# Patient Record
Sex: Female | Born: 1970 | Hispanic: Yes | Smoking: Former smoker
Health system: Southern US, Community
[De-identification: ages and names within clinical notes are randomized; demographics above are authoritative.]

---

## 2019-07-14 ENCOUNTER — Ambulatory Visit: Payer: HRSA Program | Attending: Internal Medicine

## 2019-07-14 DIAGNOSIS — Z20822 Contact with and (suspected) exposure to covid-19: Secondary | ICD-10-CM | POA: Diagnosis present

## 2019-07-16 ENCOUNTER — Telehealth: Payer: Self-pay | Admitting: General Practice

## 2019-07-16 LAB — NOVEL CORONAVIRUS, NAA: SARS-CoV-2, NAA: NOT DETECTED

## 2019-07-16 NOTE — Telephone Encounter (Signed)
Negative COVID results given. Patient results "NOT Detected." Caller expressed understanding. ° °

## 2019-08-03 ENCOUNTER — Ambulatory Visit: Payer: Self-pay | Attending: Internal Medicine

## 2019-08-03 DIAGNOSIS — Z20822 Contact with and (suspected) exposure to covid-19: Secondary | ICD-10-CM | POA: Insufficient documentation

## 2019-08-04 ENCOUNTER — Telehealth: Payer: Self-pay | Admitting: General Practice

## 2019-08-04 LAB — NOVEL CORONAVIRUS, NAA: SARS-CoV-2, NAA: NOT DETECTED

## 2019-08-04 NOTE — Telephone Encounter (Signed)
Negative COVID results given. Patient results "NOT Detected." Caller expressed understanding. ° °

## 2021-01-30 ENCOUNTER — Other Ambulatory Visit: Payer: Self-pay

## 2021-01-30 ENCOUNTER — Encounter: Payer: Self-pay | Admitting: *Deleted

## 2021-01-30 ENCOUNTER — Ambulatory Visit: Payer: Self-pay

## 2021-01-30 ENCOUNTER — Ambulatory Visit
Admission: RE | Admit: 2021-01-30 | Discharge: 2021-01-30 | Disposition: A | Discharge status: Home / Self Care | Payer: Self-pay | Source: Ambulatory Visit | Attending: Oncology | Admitting: Oncology

## 2021-01-30 ENCOUNTER — Ambulatory Visit: Payer: Self-pay | Attending: Oncology | Admitting: *Deleted

## 2021-01-30 ENCOUNTER — Encounter (INDEPENDENT_AMBULATORY_CARE_PROVIDER_SITE_OTHER): Payer: Self-pay

## 2021-01-30 VITALS — BP 105/70 | HR 71 | Temp 97.5°F | Ht 63.78 in | Wt 150.3 lb

## 2021-01-30 DIAGNOSIS — Z0000 Encounter for general adult medical examination without abnormal findings: Secondary | ICD-10-CM

## 2021-01-30 NOTE — Patient Instructions (Signed)
Gave patient hand-out, Women Staying Healthy, Active and Well from [facility], with education on breast health, pap smears, heart and colon health. 

## 2021-01-30 NOTE — Progress Notes (Signed)
  Subjective:     Patient ID: Virginia Alvarez, female   DOB: 1971-04-07, 50 y.o.   MRN: [id number]  HPI  [facility] Medical History Record - 01/30/21 0957       Breast History   Screening cycle New    Provider ([facility]) [facility]    Initial Mammogram 01/30/21    Last Mammogram Never    Recent Breast Symptoms None      Breast Cancer History   Breast Cancer History No personal or family history      Previous History of Breast Problems   Breast Surgery or Biopsy None    Breast Implants N/A    BSE Done Never      Gynecological/Obstetrical History   LMP 01/30/21    Is there any chance that the client could be pregnant?  No    Age at menarche [age]    Age at menopause n/a    PAP smear history Annually    Date of last PAP  11/17/20    Provider (P[facility]) [facility]    Age at first live birth 6    Breast fed children Yes (type length in comments)   1 year   DES Exposure No    Cervical, Uterine or Ovarian cancer No    Family history of Cervial, Uterine or Ovarian cancer No    Hysterectomy No    Cervix removed No    Ovaries removed No    Laser/Cryosurgery No    Current method of birth control None    Current method of Estrogen/Hormone replacement None    Smoking history None    Comments no insurance               Review of Systems     Objective:   Physical Exam Chest:  Breasts:    Right: No swelling, bleeding, inverted nipple, mass, nipple discharge, skin change, tenderness, axillary adenopathy or supraclavicular adenopathy.     Left: No swelling, bleeding, inverted nipple, mass, nipple discharge, skin change, tenderness, axillary adenopathy or supraclavicular adenopathy.  Lymphadenopathy:     Upper Body:     Right upper body: No supraclavicular or axillary adenopathy.     Left upper body: No supraclavicular or axillary adenopathy.      Assessment:     50 year old Hispanic female referred to [facility] by Jeralyn Ruths for clinical breast exam and baseline mammogram.   Claretha Cooper, the interpreter present during the interview and exam.  Clinical breast exam unremarkable.  Taught self breast awareness.  Last pap per patient was in May of 2022.  There are no results available to review.  Patient has been screened for eligibility.  She does not have any insurance, Medicare or Medicaid.  She also meets financial eligibility.   Risk Assessment     Risk Scores       01/30/2021   Last edited by: Scarlett Presto, RN   5-year risk: 0.7 %   Lifetime risk: 6.6 %               Plan:     Screening mammogram ordered.  Will follow up per [facility] protocol.  Next pap per AS[facility]P guidelines.

## 2021-02-01 ENCOUNTER — Other Ambulatory Visit: Payer: Self-pay | Admitting: Oncology

## 2021-02-01 DIAGNOSIS — R928 Other abnormal and inconclusive findings on diagnostic imaging of breast: Secondary | ICD-10-CM

## 2021-02-11 ENCOUNTER — Ambulatory Visit
Admission: RE | Admit: 2021-02-11 | Discharge: 2021-02-11 | Disposition: A | Discharge status: Home / Self Care | Payer: Self-pay | Source: Ambulatory Visit | Attending: Oncology | Admitting: Oncology

## 2021-02-11 ENCOUNTER — Other Ambulatory Visit: Payer: Self-pay

## 2021-02-11 DIAGNOSIS — R928 Other abnormal and inconclusive findings on diagnostic imaging of breast: Secondary | ICD-10-CM

## 2021-02-14 ENCOUNTER — Other Ambulatory Visit: Payer: Self-pay | Admitting: *Deleted

## 2021-02-14 DIAGNOSIS — N63 Unspecified lump in unspecified breast: Secondary | ICD-10-CM

## 2021-02-27 ENCOUNTER — Encounter: Payer: Self-pay | Admitting: *Deleted

## 2021-02-27 NOTE — Progress Notes (Signed)
Letter mailed to inform patient of her 6 month follow up appointment on 08/15/21 @ 10:20.

## 2021-08-15 ENCOUNTER — Ambulatory Visit
Admission: RE | Admit: 2021-08-15 | Discharge: 2021-08-15 | Disposition: A | Discharge status: Home / Self Care | Payer: Self-pay | Source: Ambulatory Visit | Attending: Oncology | Admitting: Oncology

## 2021-08-15 ENCOUNTER — Other Ambulatory Visit: Payer: Self-pay

## 2021-08-15 DIAGNOSIS — N63 Unspecified lump in unspecified breast: Secondary | ICD-10-CM | POA: Insufficient documentation

## 2022-01-08 ENCOUNTER — Other Ambulatory Visit: Payer: Self-pay

## 2022-01-08 DIAGNOSIS — N63 Unspecified lump in unspecified breast: Secondary | ICD-10-CM

## 2022-02-19 ENCOUNTER — Ambulatory Visit: Payer: Self-pay

## 2022-02-24 ENCOUNTER — Other Ambulatory Visit: Payer: Self-pay

## 2022-02-24 ENCOUNTER — Ambulatory Visit: Payer: Self-pay | Attending: Hematology and Oncology | Admitting: *Deleted

## 2022-02-24 VITALS — BP 99/54 | Wt 141.7 lb

## 2022-02-24 DIAGNOSIS — Z1211 Encounter for screening for malignant neoplasm of colon: Secondary | ICD-10-CM

## 2022-02-24 DIAGNOSIS — Z1239 Encounter for other screening for malignant neoplasm of breast: Secondary | ICD-10-CM

## 2022-02-24 NOTE — Progress Notes (Addendum)
[name] is a 51 y.o. female who presents to [facility] clinic today with complaint of bilateral upper breast pain and lumps x 5 months. Patient states the pain comes and goes. Patient rates the pain at a 6 out of 10. Patient had a right breast diagnostic mammogram and ultrasound completed 08/15/2021 that a bilateral diagnostic mammogram is recommended for follow up in 6 months.   Pap Smear: Pap smear not completed today. Last Pap smear was 11/17/2020 at [facility] clinic and was normal. Per patient is unsure if she has had an abnormal Pap smear. 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 She is unsure if her Pap smear was abnormal also. Patient states that all Pap smears have been normal since treatment and that she has had at least three normal Pap smears. Last Pap smear result is available in Epic.   Physical exam: Breasts Breasts symmetrical. No skin abnormalities bilateral breasts. No nipple retraction bilateral breasts. No nipple discharge bilateral breasts. No lymphadenopathy. No lumps palpated bilateral breasts. No complaints of pain or tenderness on exam.     MS DIGITAL DIAG TOMO UNI RIGHT  Result Date: 08/15/2021 CLINICAL DATA:  51 year old female presenting for six-month follow-up of a probably benign right breast asymmetry. EXAM: DIGITAL DIAGNOSTIC UNILATERAL RIGHT MAMMOGRAM WITH TOMOSYNTHESIS AND CAD; ULTRASOUND RIGHT BREAST LIMITED TECHNIQUE: Right digital diagnostic mammography and breast tomosynthesis was performed. The images were evaluated with computer-aided detection.; Targeted ultrasound examination of the right breast was performed COMPARISON:  Previous exam(s). ACR Breast Density Category d: The breast tissue is extremely dense, which lowers the sensitivity of mammography. FINDINGS: Mammogram: There is a stable asymmetry in the medial posterior right breast. There are no new suspicious findings elsewhere in the right breast. Ultrasound: Targeted  ultrasound performed throughout the medial aspect of the right breast demonstrating no cystic or solid mass. There is an [place] of normal tissue at 1 o'clock 5 cm from the nipple. IMPRESSION: Stable probably benign asymmetry in the medial posterior right breast. RECOMMENDATION: Diagnostic bilateral mammogram in 6 months. I have discussed the findings and recommendations with the patient. If applicable, a reminder letter will be sent to the patient regarding the next appointment. BI-RADS CATEGORY  3: Probably benign. Electronically Signed   By: Emmaline Kluver M.D.   On: 08/15/2021 11:36  MM DIAG BREAST TOMO UNI RIGHT  Result Date: 02/11/2021 CLINICAL DATA:  Patient was recalled from baseline screening mammogram for a possible mass in the right breast. EXAM: DIGITAL DIAGNOSTIC UNILATERAL RIGHT MAMMOGRAM WITH TOMOSYNTHESIS AND CAD; ULTRASOUND RIGHT BREAST LIMITED TECHNIQUE: Right digital diagnostic mammography and breast tomosynthesis was performed. The images were evaluated with computer-aided detection.; Targeted ultrasound examination of the right breast was performed COMPARISON:  Previous exam(s). ACR Breast Density Category c: The breast tissue is heterogeneously dense, which may obscure small masses. FINDINGS: Additional imaging of the right breast was performed. There is persistence of a focal asymmetry in the upper inner quadrant of the breast. Targeted ultrasound is performed, showing an isoechoic mass in the right breast at 1 o'clock 5 cm from the nipple measuring 2.0 x 0.3 x 1.3 cm. This may be a normal island of fibroglandular tissue. IMPRESSION: Probable benign island of normal fibroglandular tissue in the upper inner quadrant of the right breast. RECOMMENDATION: Short-term interval follow-up right mammogram and ultrasound in 6 months is recommended. I have discussed the findings and recommendations with the patient. If applicable, a reminder letter will be sent to  the patient regarding the next  appointment. BI-RADS CATEGORY  3: Probably benign. Electronically Signed   By: Baird Lyons M.D.   On: 02/11/2021 15:16  MS DIGITAL SCREENING TOMO BILATERAL  Result Date: 01/31/2021 CLINICAL DATA:  Screening. EXAM: DIGITAL SCREENING BILATERAL MAMMOGRAM WITH TOMOSYNTHESIS AND CAD TECHNIQUE: Bilateral screening digital craniocaudal and mediolateral oblique mammograms were obtained. Bilateral screening digital breast tomosynthesis was performed. The images were evaluated with computer-aided detection. COMPARISON:  None. ACR Breast Density Category c: The breast tissue is heterogeneously dense, which may obscure small masses. FINDINGS: In the right breast, a possible mass warrants further evaluation. This possible mass is seen within the inner RIGHT breast, at posterior depth, cc view only, slice 44. In the left breast, no findings suspicious for malignancy. IMPRESSION: Further evaluation is suggested for a possible mass in the right breast. RECOMMENDATION: Diagnostic mammogram and possibly ultrasound of the right breast. (Code:[id number]-40M) The patient will be contacted regarding the findings, and additional imaging will be scheduled. BI-RADS CATEGORY  0: Incomplete. Need additional imaging evaluation and/or prior mammograms for comparison. Electronically Signed   By: Bary Richard M.D.   On: 01/31/2021 13:43    Pelvic/Bimanual Pap is not indicated today per [facility] guidelines.   Smoking History: Patient is a former smoker that quit over 30 years ago.   Patient Navigation: Patient education provided. Access to services provided for patient through [employer] program. Spanish interpreter Virginia Alvarez from [facility] provided. Patient stated she has food insecurities. Patient given a bag of groceries from the [facility] food pantry.  Colorectal Cancer Screening: Per patient has never had colonoscopy completed. FIT Test given to patient to complete. No complaints today.     Breast and Cervical Cancer Risk Assessment: Patient does not have family history of breast cancer, known genetic mutations, or radiation treatment to the chest before age 24. Patient does not have history of cervical dysplasia, immunocompromised, or DES exposure in-utero.  Risk Assessment     Risk Scores       02/24/2022 01/30/2021   Last edited by: Narda Rutherford, LPN Scarlett Presto, RN   5-year risk: 0.7 % 0.7 %   Lifetime risk: 6.5 % 6.6 %           A: [facility] exam without pap smear Complaint of bilateral upper breast lumps and pain.  P: Referred patient to the [facility] for a diagnostic mammogram per recommendation. Appointment scheduled Wednesday, March 12, 2022 at 1040.  Priscille Heidelberg, RN 02/24/2022 11:17 AM

## 2022-02-24 NOTE — Patient Instructions (Signed)
Explained breast self awareness with Levonne Hubert. Patient did not need a Pap smear today due to last Pap smear was 11/17/2020. Let her know [facility] will cover Pap smears every 3 years unless has a history of abnormal Pap smears. Referred patient to the [facility] for a diagnostic mammogram per recommendation. Appointment scheduled Wednesday, March 12, 2022 at 1040. Patient aware of appointment and will be there. Levonne Hubert verbalized understanding.  Ettore Trebilcock, Kathaleen Maser, RN 11:17 AM

## 2022-03-12 ENCOUNTER — Ambulatory Visit
Admission: RE | Admit: 2022-03-12 | Discharge: 2022-03-12 | Disposition: A | Discharge status: Home / Self Care | Payer: Self-pay | Source: Ambulatory Visit | Attending: Obstetrics and Gynecology | Admitting: Obstetrics and Gynecology

## 2022-03-12 DIAGNOSIS — N63 Unspecified lump in unspecified breast: Secondary | ICD-10-CM

## 2022-04-04 LAB — FECAL OCCULT BLOOD, IMMUNOCHEMICAL: Fecal Occult Bld: NEGATIVE

## 2022-04-10 ENCOUNTER — Telehealth: Payer: Self-pay

## 2022-04-10 NOTE — Telephone Encounter (Signed)
Via Julie Sowell, Spanish Interpreter ([place]), Patient informed negative FIT test results, verbalized understanding. 

## 2022-10-28 ENCOUNTER — Ambulatory Visit: Payer: Self-pay

## 2022-12-18 IMAGING — MG MM DIGITAL DIAGNOSTIC UNILAT*R* W/ TOMO W/ CAD
4 series · 4 of 12 positions shown · non-contrast
Comparison: Previous exam(s).

CLINICAL DATA: Patient was recalled from baseline screening
mammogram for a possible mass in the right breast.

EXAM:
DIGITAL DIAGNOSTIC UNILATERAL RIGHT MAMMOGRAM WITH TOMOSYNTHESIS AND
CAD; ULTRASOUND RIGHT BREAST LIMITED
TECHNIQUE: Right digital diagnostic mammography and breast tomosynthesis was
performed. The images were evaluated with computer-aided detection.;
Targeted ultrasound examination of the right breast was performed

[R CC synth-2D]
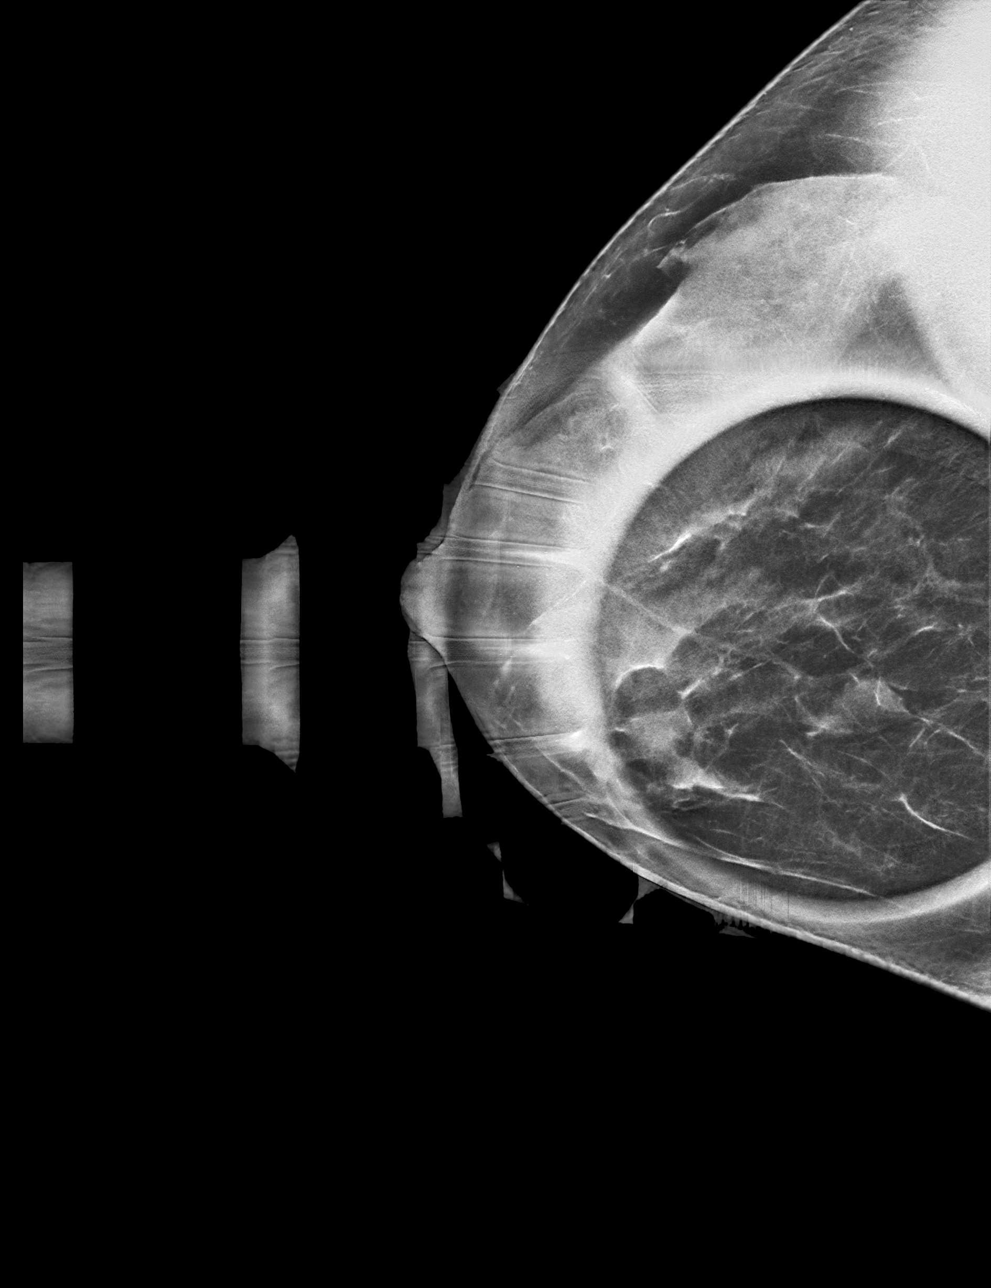

[R ML synth-2D]
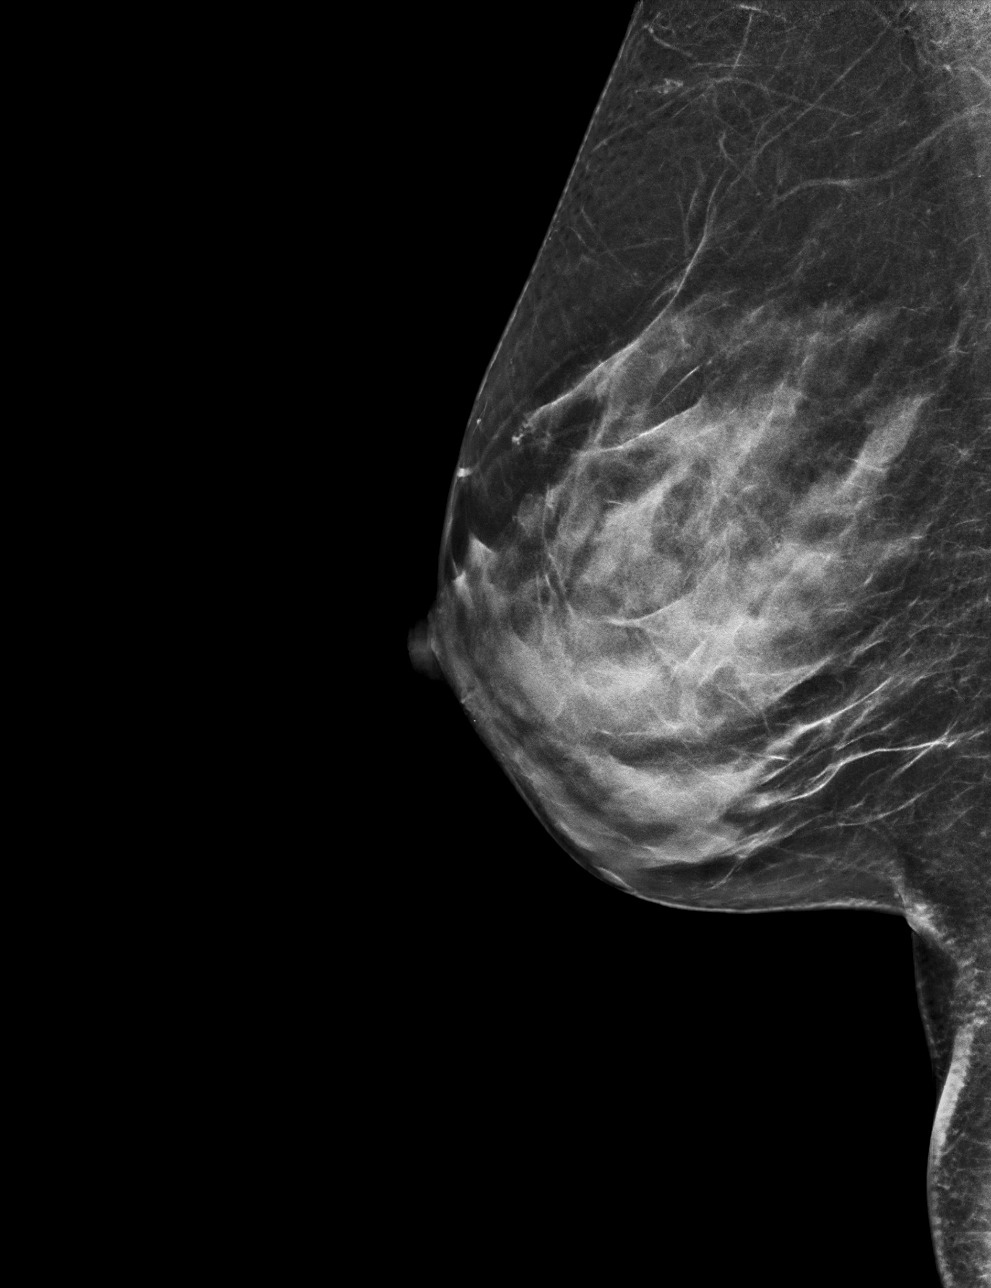

[R CC tomo · tomo slice 29/57.0]
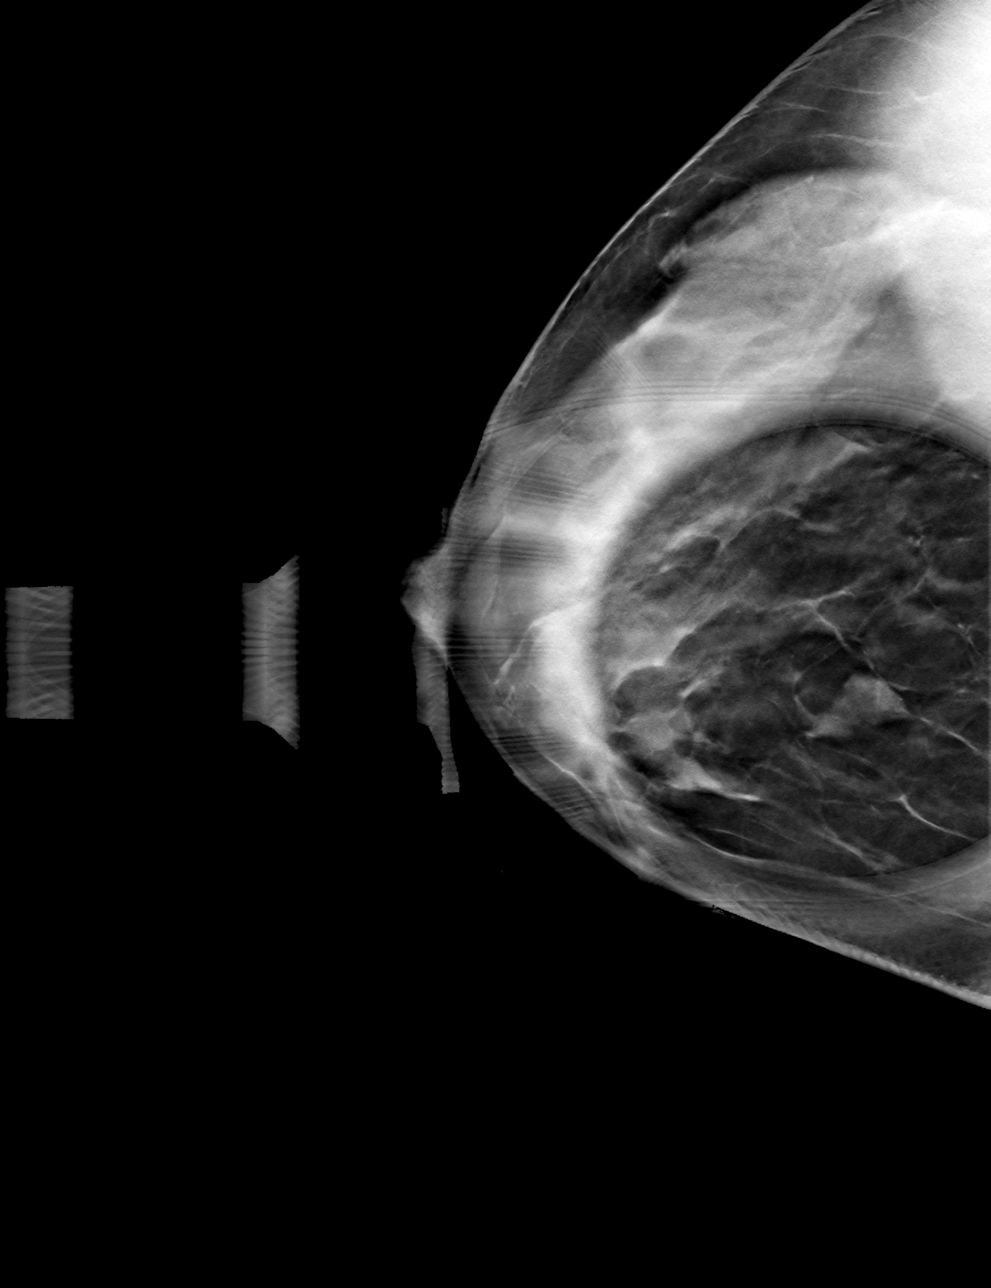

[R ML tomo · tomo slice 27/54.0]
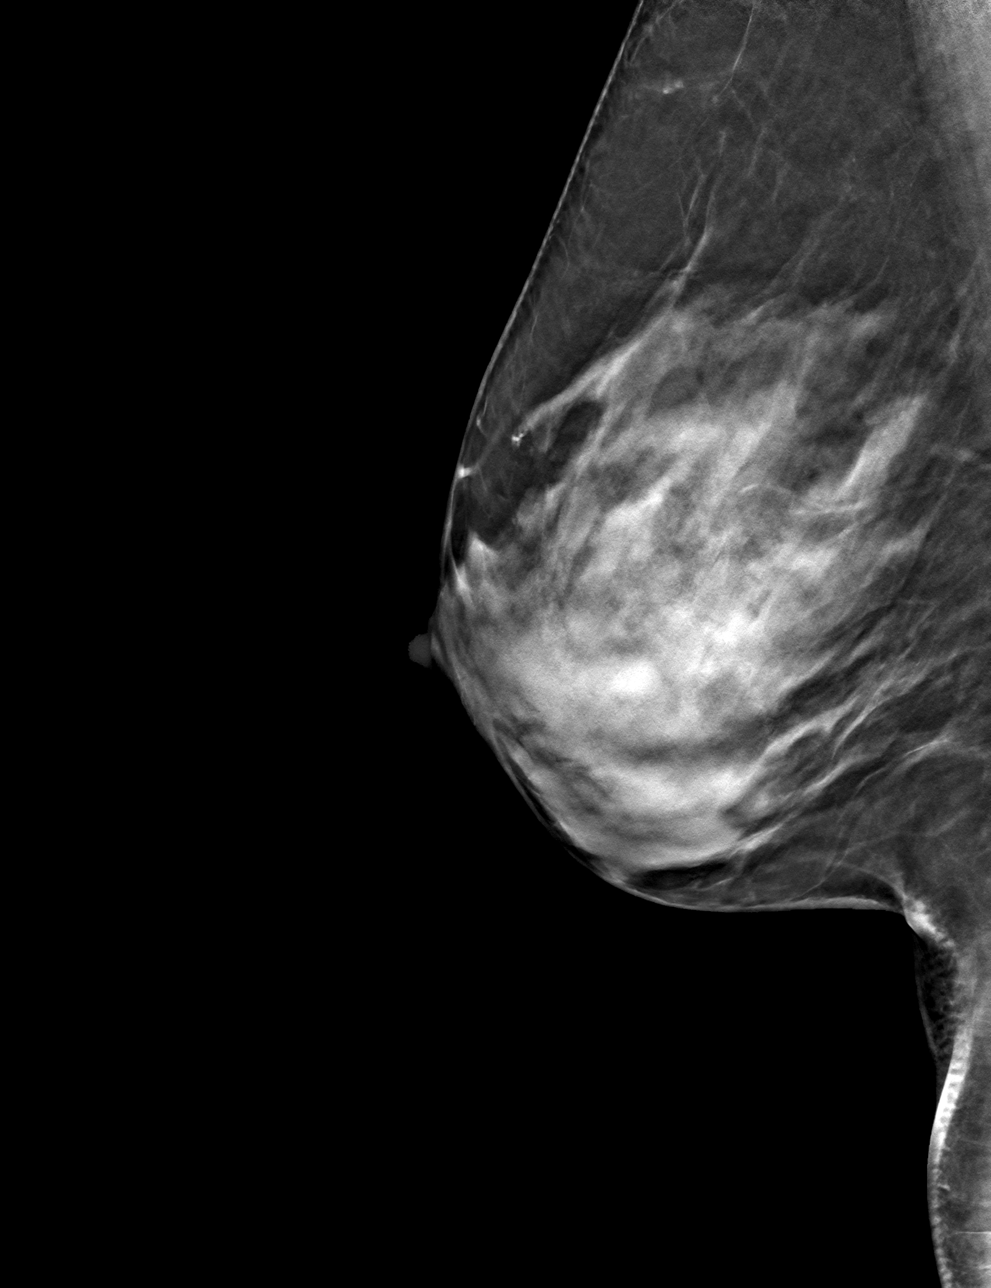

[4 of 12 positions shown; findings below may reference images not displayed]

ACR Breast Density Category c: The breast tissue is heterogeneously
dense, which may obscure small masses.
FINDINGS: Additional imaging of the right breast was performed. There is
persistence of a focal asymmetry in the upper inner quadrant of the
breast.

Targeted ultrasound is performed, showing an isoechoic mass in the
right breast at 1 o'clock 5 cm from the nipple measuring 2.0 x 0.3 x
1.3 cm. This may be a normal island of fibroglandular tissue.
IMPRESSION: Probable benign island of normal fibroglandular tissue in the upper
inner quadrant of the right breast.

RECOMMENDATION:
Short-term interval follow-up right mammogram and ultrasound in 6
months is recommended.

I have discussed the findings and recommendations with the patient.
If applicable, a reminder letter will be sent to the patient
regarding the next appointment.

BI-RADS CATEGORY  3: Probably benign.

## 2022-12-23 ENCOUNTER — Ambulatory Visit: Payer: Self-pay

## 2022-12-23 ENCOUNTER — Ambulatory Visit (LOCAL_COMMUNITY_HEALTH_CENTER): Payer: Self-pay

## 2022-12-23 DIAGNOSIS — Z719 Counseling, unspecified: Secondary | ICD-10-CM

## 2022-12-23 DIAGNOSIS — Z23 Encounter for immunization: Secondary | ICD-10-CM

## 2022-12-23 NOTE — Progress Notes (Addendum)
In nurse clinic with son and requesting covid vaccine.  Son served as Equities trader for pt; declination of agency interpreter services signed.  See imm flowsheet.  VIS given.  Bridge Comirnaty 12+ 2023-24 formula administered; tolerated well.  Stayed for observation 15 minutes and no problems.    [facility] updated and copy to pt.  Cherlynn Polo, RN

## 2023-01-15 ENCOUNTER — Encounter: Payer: Self-pay | Admitting: Advanced Practice Midwife

## 2023-01-15 ENCOUNTER — Ambulatory Visit (LOCAL_COMMUNITY_HEALTH_CENTER): Payer: Self-pay | Admitting: Advanced Practice Midwife

## 2023-01-15 VITALS — BP 101/70 | HR 72 | Ht 64.0 in | Wt 141.0 lb

## 2023-01-15 DIAGNOSIS — K581 Irritable bowel syndrome with constipation: Secondary | ICD-10-CM

## 2023-01-15 DIAGNOSIS — Z01419 Encounter for gynecological examination (general) (routine) without abnormal findings: Secondary | ICD-10-CM

## 2023-01-15 DIAGNOSIS — F329 Major depressive disorder, single episode, unspecified: Secondary | ICD-10-CM

## 2023-01-15 DIAGNOSIS — K589 Irritable bowel syndrome without diarrhea: Secondary | ICD-10-CM | POA: Insufficient documentation

## 2023-01-15 DIAGNOSIS — F32A Depression, unspecified: Secondary | ICD-10-CM | POA: Insufficient documentation

## 2023-01-15 DIAGNOSIS — Z3009 Encounter for other general counseling and advice on contraception: Secondary | ICD-10-CM

## 2023-01-15 DIAGNOSIS — R32 Unspecified urinary incontinence: Secondary | ICD-10-CM

## 2023-01-15 LAB — WET PREP FOR TRICH, YEAST, CLUE
Trichomonas Exam: NEGATIVE
Yeast Exam: NEGATIVE

## 2023-01-15 LAB — HEMOGLOBIN, FINGERSTICK: Hemoglobin: 13.9 g/dL (ref 11.1–15.9)

## 2023-01-15 LAB — HM HIV SCREENING LAB: HM HIV Screening: NEGATIVE

## 2023-01-15 NOTE — Progress Notes (Signed)
Pt is here for PE and STD screening.  Wet mount results reviewed, no treatment required per SO.  Pt notified of normal Hgb results.  FP packet given.  Berdie Ogren, RN

## 2023-01-15 NOTE — Progress Notes (Signed)
[facility] [address] Main Number: [phone]   Family Planning Visit- Initial Visit  Subjective:  Virginia Alvarez is a 52 y.o. SHF exsmoker G2P2 (21, 23)  being seen today for an initial annual visit and to discuss reproductive life planning.  The patient is currently using Abstinence for pregnancy prevention. Patient reports   does not want a pregnancy in the next year.    report they are looking for a method that provides Other no method wanted  Patient has the following medical conditions does not have a problem list on file.  Chief Complaint  Patient presents with   Annual Exam    And Pap    Patient reports here for physical. C/o constipation, vaginal and anal itching recently, urinary incontinence not relieved with Kegals. [facility] per pt. LMP 01/02/23. Last sex 14-15 years ago. Last cig age 22. Last ETOH 12/20/22 (1 Smirnoff). Last mammogram 2 years ago. Last dental exam 10/2022. 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   Patient denies vaping, cigars, MJ  Body mass index is 24.2 kg/m. - Patient is eligible for diabetes screening based on BMI> 25 and age >35?  yes HA1C ordered? yes  Patient reports 0  partner/s in last year. Desires STI screening?  No - declines HIV, syphillis  Has patient been screened once for HCV in the past?  No  No results found for: "HCVAB"  Does the patient have current drug use (including MJ), have a partner with drug use, and/or has been incarcerated since last result? No  If yes-- Screen for HCV through [facility]   Does the patient meet criteria for HBV testing? Yes  Criteria:  -Household, sexual or needle sharing contact with HBV -History of drug use -HIV positive -Those with known Hep C   Health Maintenance Due  Topic Date Due   HIV Screening  Never done   Hepatitis C Screening  Never done   DTaP/Tdap/Td (1 - Tdap) Never done   PAP  SMEAR-Modifier  Never done   Colonoscopy  Never done   Zoster Vaccines- Shingrix (1 of 2) Never done    Review of Systems  Constitutional:  Positive for weight loss (4 lb wt loss in last 3 mo; pt pleased).  Eyes:  Positive for blurred vision (dry eyes and blurry vision; last eye exam 1 1/2 yrs ago; encouraged exam).  Neurological:  Positive for headaches (2-3x/day bilateral temples relieved with eating or sleep. -N&V, -vision, -audio).    The following portions of the patient's history were reviewed and updated as appropriate: allergies, current medications, past family history, past medical history, past social history, past surgical history and problem list. Problem list updated.   See flowsheet for other program required questions.  Objective:   Vitals:   01/15/23 1315  BP: 101/70  Pulse: 72  Weight: 141 lb (64 kg)  Height: 5\' 4"  (1.626 m)    Physical Exam Constitutional:      Appearance: Normal appearance. She is normal weight.  HENT:     Head: Normocephalic and atraumatic.     Mouth/Throat:     Mouth: Mucous membranes are moist.     Comments: Last dental exam 10/2022; pt states she has a mouth guard because she grinds her teeth but lower molar hurts; to discuss with dentist Eyes:     Conjunctiva/sclera: Conjunctivae normal.  Neck:     Thyroid: No  thyroid mass, thyromegaly or thyroid tenderness.  Cardiovascular:     Rate and Rhythm: Normal rate and regular rhythm.  Pulmonary:     Effort: Pulmonary effort is normal.     Breath sounds: Normal breath sounds.  Chest:  Breasts:    Right: Normal.     Left: Normal.  Abdominal:     General: Abdomen is flat.     Palpations: Abdomen is soft.     Comments: Soft without masses or tenderness, fair tone  Genitourinary:    General: Normal vulva.     Exam position: Lithotomy position.     Vagina: Vaginal discharge (moderate amt white creamy leukorrhea, ph<4.5) present.     Cervix: Normal.     Uterus: Normal.      Adnexa:  Right adnexa normal and left adnexa normal.     Rectum: Normal.  Musculoskeletal:        General: Normal range of motion.     Cervical back: Normal range of motion and neck supple.  Skin:    General: Skin is warm and dry.  Neurological:     Mental Status: She is alert.  Psychiatric:        Mood and Affect: Mood normal.       Assessment and Plan:  Virginia Alvarez is a 52 y.o. female presenting to the [facility] Department for an initial annual wellness/contraceptive visit  Contraception counseling: Reviewed options based on patient desire and reproductive life plan. Patient is interested in Abstinence. This was provided to the patient today.  if not why not clearly documented  Risks, benefits, and typical effectiveness rates were reviewed.  Questions were answered.  Written information was also given to the patient to review.    The patient will follow up in  1 years for surveillance.  The patient was told to call with any further questions, or with any concerns about this method of contraception.  Emphasized use of condoms 100% of the time for STI prevention.  Educated on ECP and assessed for need of ECP. Patient reported not meeting criteria.  Reviewed options and patient desired No method of ECP, declined all    1. Family planning Declines birth control FSH drawn  2. Well woman exam with routine gynecological exam Need ROI for 11/17/20 pap at [facility] please Treat wet mount per standing orders Immunization nurse consult Contact # given for Kathreen Cosier, LCSW Suggestions given for constipation and urinary incontinence  - Hgb A1c w/o eAG - Follicle stimulating hormone - WET PREP FOR TRICH, YEAST, CLUE - Hemoglobin, venipuncture - Chlamydia/Gonorrhea [place]   No follow-ups on file.  No future appointments.  [name], CNM

## 2023-01-16 LAB — HGB A1C W/O EAG: Hgb A1c MFr Bld: 5.5 % (ref 4.8–5.6)

## 2023-01-16 LAB — FOLLICLE STIMULATING HORMONE: FSH: 8.9 m[IU]/mL

## 2023-02-04 ENCOUNTER — Telehealth: Payer: Self-pay

## 2023-02-04 ENCOUNTER — Encounter: Payer: Self-pay | Admitting: Primary Care

## 2023-02-04 NOTE — Telephone Encounter (Signed)
-----   Message from [name] sent at 01/16/2023  8:17 AM EDT ----- Hgb=13.9, FSH=8.9, HgbA1C=5.5 Please call pt, Virginia Alvarez, and give her these values--wnl. Thanks

## 2023-02-09 ENCOUNTER — Ambulatory Visit: Payer: Self-pay

## 2023-02-19 ENCOUNTER — Other Ambulatory Visit: Payer: Self-pay

## 2023-02-19 DIAGNOSIS — N6310 Unspecified lump in the right breast, unspecified quadrant: Secondary | ICD-10-CM

## 2023-03-16 ENCOUNTER — Ambulatory Visit: Payer: Self-pay | Attending: Hematology and Oncology | Admitting: Hematology and Oncology

## 2023-03-16 ENCOUNTER — Ambulatory Visit
Admission: RE | Admit: 2023-03-16 | Discharge: 2023-03-16 | Disposition: A | Discharge status: Home / Self Care | Payer: Self-pay | Source: Ambulatory Visit | Attending: Obstetrics and Gynecology | Admitting: Obstetrics and Gynecology

## 2023-03-16 ENCOUNTER — Other Ambulatory Visit: Payer: Self-pay

## 2023-03-16 VITALS — BP 111/65 | Wt 142.8 lb

## 2023-03-16 DIAGNOSIS — N6310 Unspecified lump in the right breast, unspecified quadrant: Secondary | ICD-10-CM | POA: Insufficient documentation

## 2023-03-16 DIAGNOSIS — Z1211 Encounter for screening for malignant neoplasm of colon: Secondary | ICD-10-CM

## 2023-03-16 DIAGNOSIS — N6489 Other specified disorders of breast: Secondary | ICD-10-CM

## 2023-03-16 NOTE — Patient Instructions (Signed)
Taught Virginia Alvarez about self breast awareness and gave educational materials to take home. Patient did not need a Pap smear today due to last Pap smear was in 11/13/2020 per patient.  Let her know [facility] will cover Pap smears every 5 years unless has a history of abnormal Pap smears. Referred patient to the [facility] for diagnostic mammogram. Appointment scheduled for 03/16/23. Patient aware of appointment and will be there. Let patient know will follow up with her within the next couple weeks with results. Virginia Alvarez verbalized understanding.  Pascal Lux, NP 9:26 AM

## 2023-03-16 NOTE — Progress Notes (Signed)
[name] is a 52 y.o. female who presents to [facility] clinic today with no complaints. Follow up of probable benign right breast asymmetry.    Pap Smear: Pap not smear completed today. Last Pap smear was 11/13/2020 at [facility] clinic and was normal. Per patient has no history of an abnormal Pap smear. Last Pap smear result is available in Epic.   Physical exam: Breasts Breasts symmetrical. No skin abnormalities bilateral breasts. No nipple retraction bilateral breasts. No nipple discharge bilateral breasts. No lymphadenopathy. No lumps palpated bilateral breasts. MS DIGITAL DIAG TOMO BILAT  Result Date: 03/12/2022 CLINICAL DATA:  52 year old female presenting for annual bilateral mammogram in 1 year follow-up of a probably benign right breast asymmetry. EXAM: DIGITAL DIAGNOSTIC BILATERAL MAMMOGRAM WITH TOMOSYNTHESIS TECHNIQUE: Bilateral digital diagnostic mammography and breast tomosynthesis was performed. COMPARISON:  Previous exam(s). ACR Breast Density Category d: The breast tissue is extremely dense, which lowers the sensitivity of mammography. FINDINGS: Masslike asymmetry in the medial right breast at posterior depth on the CC projection is mammographically stable. Otherwise, no new or suspicious findings in either breast. IMPRESSION: 1. Stable, probably benign right breast asymmetry. Recommend a final follow-up in 1 year. 2. No mammographic evidence of malignancy on the left. RECOMMENDATION: Bilateral diagnostic mammogram in 1 year. I have discussed the findings and recommendations with the patient. If applicable, a reminder letter will be sent to the patient regarding the next appointment. BI-RADS CATEGORY  3: Probably benign. Electronically Signed   By: Sande Brothers M.D.   On: 03/12/2022 10:31  MS DIGITAL DIAG TOMO UNI RIGHT  Result Date: 08/15/2021 CLINICAL DATA:  52 year old female presenting for six-month follow-up of a probably benign right breast asymmetry. EXAM: DIGITAL  DIAGNOSTIC UNILATERAL RIGHT MAMMOGRAM WITH TOMOSYNTHESIS AND CAD; ULTRASOUND RIGHT BREAST LIMITED TECHNIQUE: Right digital diagnostic mammography and breast tomosynthesis was performed. The images were evaluated with computer-aided detection.; Targeted ultrasound examination of the right breast was performed COMPARISON:  Previous exam(s). ACR Breast Density Category d: The breast tissue is extremely dense, which lowers the sensitivity of mammography. FINDINGS: Mammogram: There is a stable asymmetry in the medial posterior right breast. There are no new suspicious findings elsewhere in the right breast. Ultrasound: Targeted ultrasound performed throughout the medial aspect of the right breast demonstrating no cystic or solid mass. There is an [place] of normal tissue at 1 o'clock 5 cm from the nipple. IMPRESSION: Stable probably benign asymmetry in the medial posterior right breast. RECOMMENDATION: Diagnostic bilateral mammogram in 6 months. I have discussed the findings and recommendations with the patient. If applicable, a reminder letter will be sent to the patient regarding the next appointment. BI-RADS CATEGORY  3: Probably benign. Electronically Signed   By: Emmaline Kluver M.D.   On: 08/15/2021 11:36  MM DIAG BREAST TOMO UNI RIGHT  Result Date: 02/11/2021 CLINICAL DATA:  Patient was recalled from baseline screening mammogram for a possible mass in the right breast. EXAM: DIGITAL DIAGNOSTIC UNILATERAL RIGHT MAMMOGRAM WITH TOMOSYNTHESIS AND CAD; ULTRASOUND RIGHT BREAST LIMITED TECHNIQUE: Right digital diagnostic mammography and breast tomosynthesis was performed. The images were evaluated with computer-aided detection.; Targeted ultrasound examination of the right breast was performed COMPARISON:  Previous exam(s). ACR Breast Density Category c: The breast tissue is heterogeneously dense, which may obscure small masses. FINDINGS: Additional imaging of the right breast was performed. There is persistence of a  focal asymmetry in the upper inner quadrant of the breast. Targeted ultrasound is performed, showing an isoechoic mass in the right  breast at 1 o'clock 5 cm from the nipple measuring 2.0 x 0.3 x 1.3 cm. This may be a normal island of fibroglandular tissue. IMPRESSION: Probable benign island of normal fibroglandular tissue in the upper inner quadrant of the right breast. RECOMMENDATION: Short-term interval follow-up right mammogram and ultrasound in 6 months is recommended. I have discussed the findings and recommendations with the patient. If applicable, a reminder letter will be sent to the patient regarding the next appointment. BI-RADS CATEGORY  3: Probably benign. Electronically Signed   By: Baird Lyons M.D.   On: 02/11/2021 15:16  MS DIGITAL SCREENING TOMO BILATERAL  Result Date: 01/31/2021 CLINICAL DATA:  Screening. EXAM: DIGITAL SCREENING BILATERAL MAMMOGRAM WITH TOMOSYNTHESIS AND CAD TECHNIQUE: Bilateral screening digital craniocaudal and mediolateral oblique mammograms were obtained. Bilateral screening digital breast tomosynthesis was performed. The images were evaluated with computer-aided detection. COMPARISON:  None. ACR Breast Density Category c: The breast tissue is heterogeneously dense, which may obscure small masses. FINDINGS: In the right breast, a possible mass warrants further evaluation. This possible mass is seen within the inner RIGHT breast, at posterior depth, cc view only, slice 44. In the left breast, no findings suspicious for malignancy. IMPRESSION: Further evaluation is suggested for a possible mass in the right breast. RECOMMENDATION: Diagnostic mammogram and possibly ultrasound of the right breast. (Code:[id number]) The patient will be contacted regarding the findings, and additional imaging will be scheduled. BI-RADS CATEGORY  0: Incomplete. Need additional imaging evaluation and/or prior mammograms for comparison. Electronically Signed   By: Bary Richard M.D.   On: 01/31/2021  13:43          Pelvic/Bimanual Pap is not indicated today    Smoking History: Patient has never smoked and was not referred to quit line.    Patient Navigation: Patient education provided. Access to services provided for patient through [facility] program. Delos Haring interpreter provided. No transportation provided   Colorectal Cancer Screening: Per patient has never had colonoscopy completed No complaints today.    Breast and Cervical Cancer Risk Assessment: Patient does not have family history of breast cancer, known genetic mutations, or radiation treatment to the chest before age 23. Patient does not have history of cervical dysplasia, immunocompromised, or DES exposure in-utero.  Risk Assessment   No risk assessment data for the current encounter  Risk Scores       02/24/2022   Last edited by: Narda Rutherford, LPN   5-year risk: 0.7%   Lifetime risk: 6.5%            A: [facility] exam without pap smear No complaints with benign exam. Follow up probable benign right breast asymmetry.   P: Referred patient to the [facility] for a diagnostic mammogram. Appointment scheduled 03/16/23.  Ilda Basset A, NP 03/16/2023 9:22 AM
